# Patient Record
Sex: Male | Born: 1985 | Race: Black or African American | Hispanic: No | Marital: Married | Smoking: Never smoker
Health system: Southern US, Community
[De-identification: ages and names within clinical notes are randomized; demographics above are authoritative.]

## PROBLEM LIST (undated history)

## (undated) DIAGNOSIS — K219 Gastro-esophageal reflux disease without esophagitis: Secondary | ICD-10-CM

## (undated) DIAGNOSIS — I10 Essential (primary) hypertension: Secondary | ICD-10-CM

---

## 2009-04-05 ENCOUNTER — Encounter: Admission: RE | Admit: 2009-04-05 | Discharge: 2009-04-05 | Payer: Self-pay | Admitting: Family Medicine

## 2017-01-27 ENCOUNTER — Emergency Department (HOSPITAL_COMMUNITY): Payer: 59

## 2017-01-27 ENCOUNTER — Emergency Department (HOSPITAL_COMMUNITY)
Admission: EM | Admit: 2017-01-27 | Discharge: 2017-01-27 | Disposition: A | Discharge status: Home / Self Care | Payer: 59 | Attending: Emergency Medicine | Admitting: Emergency Medicine

## 2017-01-27 ENCOUNTER — Encounter (HOSPITAL_COMMUNITY): Payer: Self-pay | Admitting: Emergency Medicine

## 2017-01-27 DIAGNOSIS — I10 Essential (primary) hypertension: Secondary | ICD-10-CM | POA: Insufficient documentation

## 2017-01-27 DIAGNOSIS — K2970 Gastritis, unspecified, without bleeding: Secondary | ICD-10-CM | POA: Diagnosis not present

## 2017-01-27 DIAGNOSIS — Z79899 Other long term (current) drug therapy: Secondary | ICD-10-CM | POA: Diagnosis not present

## 2017-01-27 DIAGNOSIS — K2900 Acute gastritis without bleeding: Secondary | ICD-10-CM

## 2017-01-27 DIAGNOSIS — R109 Unspecified abdominal pain: Secondary | ICD-10-CM | POA: Diagnosis present

## 2017-01-27 HISTORY — DX: Essential (primary) hypertension: I10

## 2017-01-27 HISTORY — DX: Gastro-esophageal reflux disease without esophagitis: K21.9

## 2017-01-27 LAB — COMPREHENSIVE METABOLIC PANEL
ALT: 40 U/L (ref 17–63)
AST: 29 U/L (ref 15–41)
Albumin: 4.5 g/dL (ref 3.5–5.0)
Alkaline Phosphatase: 91 U/L (ref 38–126)
Anion gap: 8 (ref 5–15)
BUN: 16 mg/dL (ref 6–20)
CHLORIDE: 102 mmol/L (ref 101–111)
CO2: 26 mmol/L (ref 22–32)
Calcium: 9.3 mg/dL (ref 8.9–10.3)
Creatinine, Ser: 1.25 mg/dL — ABNORMAL HIGH (ref 0.61–1.24)
Glucose, Bld: 101 mg/dL — ABNORMAL HIGH (ref 65–99)
Potassium: 3.7 mmol/L (ref 3.5–5.1)
SODIUM: 136 mmol/L (ref 135–145)
Total Bilirubin: 1.4 mg/dL — ABNORMAL HIGH (ref 0.3–1.2)
Total Protein: 8.2 g/dL — ABNORMAL HIGH (ref 6.5–8.1)

## 2017-01-27 LAB — CBC
HCT: 49.4 % (ref 39.0–52.0)
HEMOGLOBIN: 17.4 g/dL — AB (ref 13.0–17.0)
MCH: 31 pg (ref 26.0–34.0)
MCHC: 35.2 g/dL (ref 30.0–36.0)
MCV: 87.9 fL (ref 78.0–100.0)
Platelets: 292 10*3/uL (ref 150–400)
RBC: 5.62 MIL/uL (ref 4.22–5.81)
RDW: 12.3 % (ref 11.5–15.5)
WBC: 6.7 10*3/uL (ref 4.0–10.5)

## 2017-01-27 LAB — URINALYSIS, ROUTINE W REFLEX MICROSCOPIC
Bacteria, UA: NONE SEEN
Glucose, UA: NEGATIVE mg/dL
HGB URINE DIPSTICK: NEGATIVE
KETONES UR: NEGATIVE mg/dL
LEUKOCYTES UA: NEGATIVE
Nitrite: NEGATIVE
PH: 5 (ref 5.0–8.0)
Protein, ur: 30 mg/dL — AB
SPECIFIC GRAVITY, URINE: 1.035 — AB (ref 1.005–1.030)
SQUAMOUS EPITHELIAL / LPF: NONE SEEN

## 2017-01-27 LAB — LIPASE, BLOOD: LIPASE: 23 U/L (ref 11–51)

## 2017-01-27 MED ORDER — SODIUM CHLORIDE 0.9 % IV SOLN
INTRAVENOUS | Status: DC
  Administered 2017-01-27: 14:00:00 via INTRAVENOUS

## 2017-01-27 MED ORDER — IOPAMIDOL (ISOVUE-300) INJECTION 61%
INTRAVENOUS | Status: AC
  Administered 2017-01-27: 100 mL
  Filled 2017-01-27: qty 100

## 2017-01-27 MED ORDER — FAMOTIDINE IN NACL 20-0.9 MG/50ML-% IV SOLN
20.0000 mg | Freq: Once | INTRAVENOUS | Status: AC
  Administered 2017-01-27: 20 mg via INTRAVENOUS
  Filled 2017-01-27: qty 50

## 2017-01-27 MED ORDER — ONDANSETRON HCL 4 MG/2ML IJ SOLN
4.0000 mg | Freq: Once | INTRAMUSCULAR | Status: AC
  Administered 2017-01-27: 4 mg via INTRAVENOUS
  Filled 2017-01-27: qty 2

## 2017-01-27 NOTE — Discharge Instructions (Signed)
Make an appointment to follow up with your doctor. Return here for worsening symptoms.

## 2017-01-27 NOTE — ED Notes (Signed)
Bed: WTR9 Expected date:  Expected time:  Means of arrival:  Comments: 

## 2017-01-27 NOTE — ED Triage Notes (Addendum)
Pt reports abd pain for the past several weeks. Has seen PCP for this and is on acid reflux medications for this. Pt reports his medications are helping.  Pt reports he ate a large meal yesterday and had an exacerbation of symptoms along with 1 episode of n/v/d. Pt reports he feels nauseated today which worse with lying down. Threw up 1x this am.

## 2017-01-27 NOTE — ED Triage Notes (Signed)
Ultrasound IV start requested by pt.

## 2017-01-27 NOTE — ED Provider Notes (Signed)
[facility]-EMERGENCY DEPT Provider Note   CSN: [id number] Arrival date & time: 01/27/17  0915     History   Chief Complaint Chief Complaint  Patient presents with  . Abdominal Pain    HPI Michael Suarez is a 32 y.o. male who presents to the ED with abdominal pain. Patient reports that the pain started a few weeks ago. Patient went to his PCP and was started on medication for acid reflex and the medication does seem to help. Patient reports eating a large meal with a lot of grease yesterday and had worsening symptoms and n/v. He continues to have nausea today but no vomiting. The pain is in the epigastric area.  HPI  Past Medical History:  Diagnosis Date  . GERD (gastroesophageal reflux disease)   . Hypertension     There are no active problems to display for this patient.   History reviewed. No pertinent surgical history.     Home Medications    Prior to Admission medications   Medication Sig Start Date End Date Taking? Authorizing Provider  amLODipine (NORVASC) 10 MG tablet Take 10 mg by mouth daily. 01/21/17  Yes [provider]  naproxen sodium (ALEVE) 220 MG tablet Take 440 mg by mouth daily as needed (PAIN, HEADACHE, STOMACH PAIN).   Yes [provider]  omeprazole (PRILOSEC) 20 MG capsule Take 20 mg by mouth daily.   Yes [provider]    Family History History reviewed. No pertinent family history.  Social History Social History   Tobacco Use  . Smoking status: Never Smoker  . Smokeless tobacco: Never Used  Substance Use Topics  . Alcohol use: Yes    Frequency: Never    Comment: occasional  . Drug use: Not on file     Allergies   Patient has no known allergies.   Review of Systems Review of Systems  Constitutional: Positive for chills. Negative for fever.  HENT: Negative.   Eyes: Negative for visual disturbance.  Respiratory: Negative for cough, shortness of breath and wheezing.     Cardiovascular: Negative for chest pain.       Epigastric burning  Gastrointestinal: Positive for abdominal pain, nausea and vomiting.  Genitourinary: Negative for dysuria, hematuria and urgency.  Musculoskeletal: Negative for arthralgias, back pain, joint swelling and neck stiffness.  Skin: Negative for rash.  Neurological: Negative for syncope, light-headedness and headaches.  Hematological: Negative for adenopathy.  Psychiatric/Behavioral: Negative for confusion. The patient is not nervous/anxious.      Physical Exam Updated Vital Signs BP 126/86   Pulse 65   Temp 98.3 F (36.8 C) (Oral)   Resp 17   SpO2 99%   Physical Exam  Constitutional: He appears well-developed and well-nourished. No distress.  HENT:  Head: Normocephalic and atraumatic.  Mouth/Throat: Oropharynx is clear and moist.  Eyes: Conjunctivae and EOM are normal. Pupils are equal, round, and reactive to light.  Neck: Normal range of motion. Neck supple.  Cardiovascular: Normal rate and regular rhythm.  Pulmonary/Chest: Effort normal and breath sounds normal.  Abdominal: Soft. There is tenderness in the epigastric area. There is no rebound and no guarding.  Musculoskeletal: Normal range of motion.  Neurological: He is alert.  Skin: Skin is warm and dry.  Psychiatric: He has a normal mood and affect. His behavior is normal.  Nursing note and vitals reviewed.    ED Treatments / Results  Labs (all labs ordered are listed, but only abnormal results are displayed) Labs Reviewed  COMPREHENSIVE METABOLIC PANEL - Abnormal; Notable for the following components:      Result Value   Glucose, Bld 101 (*)    Creatinine, Ser 1.25 (*)    Total Protein 8.2 (*)    Total Bilirubin 1.4 (*)    All other components within normal limits  CBC - Abnormal; Notable for the following components:   Hemoglobin 17.4 (*)    All other components within normal limits  URINALYSIS, ROUTINE W REFLEX MICROSCOPIC - Abnormal; Notable  for the following components:   Color, Urine AMBER (*)    Specific Gravity, Urine 1.035 (*)    Bilirubin Urine SMALL (*)    Protein, ur 30 (*)    All other components within normal limits  LIPASE, BLOOD  H. PYLORI ANTIBODY, IGG    Radiology Ct Abdomen Pelvis W Contrast  Result Date: 01/27/2017 CLINICAL DATA:  Hematemesis.  Nausea and vomiting. EXAM: CT ABDOMEN AND PELVIS WITH CONTRAST TECHNIQUE: Multidetector CT imaging of the abdomen and pelvis was performed using the standard protocol following bolus administration of intravenous contrast. CONTRAST:  ISOVUE-300 IOPAMIDOL (ISOVUE-300) INJECTION 61% COMPARISON:  None. FINDINGS: Lower chest: Lung bases are clear. Hepatobiliary: There is hepatic steatosis. There is a 5 mm probable cyst in the inferior posterior segment right lobe of liver. No other focal lesion evident. Gallbladder wall is not appreciably thickened. There is no biliary duct dilatation. Pancreas: There is no pancreatic mass or inflammatory focus. Spleen: No splenic lesions are evident. Adrenals/Urinary Tract: Adrenals appear normal bilaterally. Kidneys bilaterally show no evident mass or hydronephrosis on either side. There is no renal or ureteral calculus on either side. Urinary bladder is midline with wall thickness within normal limits. Stomach/Bowel: There is no evident bowel wall or mesenteric thickening. No bowel obstruction evident. No free air or portal venous air. Vascular/Lymphatic: There is no abdominal aortic aneurysm. No vascular lesions are evident in the abdomen or pelvis. By size criteria, there is no adenopathy in the abdomen or pelvis. There are several subcentimeter lymph nodes in the right mid to lower abdomen. Reproductive: Prostate and seminal vesicles appear normal in size and contour. No pelvic mass evident. Other: There is no appendiceal region inflammation. No abscess or ascites is evident in the abdomen or pelvis. There is a small ventral hernia containing  fat. There is fat in each inguinal ring, slightly larger on the left than on the right. Musculoskeletal: There are no blastic or lytic bone lesions. No intramuscular or abdominal wall lesion evident. IMPRESSION: 1. There are several subcentimeter lymph nodes in the right mid to lower abdomen. By size criteria, there is no frank adenopathy in the abdomen or pelvis. In the appropriate clinical setting, these lymph nodes could represent a degree of mesenteric adenitis. 2. There is no appreciable bowel obstruction or bowel wall thickening. No abscess. No periappendiceal region inflammation. 3.  No renal or ureteral calculus.  No hydronephrosis. 4.  There is hepatic steatosis. 5. Small ventral hernia containing only fat. Fat in each inguinal ring noted. Electronically Signed   By: Bretta Bang III M.D.   On: 01/27/2017 14:54    Procedures Procedures (including critical care time)  Medications Ordered in ED Medications  famotidine (PEPCID) IVPB 20 mg premix (0 mg Intravenous Stopped 01/27/17 1549)  0.9 %  sodium chloride infusion ( Intravenous Stopped 01/27/17 1540)  ondansetron (ZOFRAN) injection 4 mg (4 mg Intravenous Given 01/27/17 1428)  iopamidol (ISOVUE-300) 61 % injection (100 mLs  Contrast Given 01/27/17 1433)  Initial Impression / Assessment and Plan / ED Course  I have reviewed the triage vital signs and the nursing notes. 32 y.o. male with hx of reflux and here today with nausea, vomiting and epigastric pain stable for d/c without ulcer noted on CT scan. Discussed with the patient that H. Pylori study will not be back today but he can f/u with PCP and they can discuss results when there are back. Patient voices understanding and agrees with plan. Patient feeling better after IV fluids and medications. Patient is eating and drinking in exam room without n/v. Discussed diet for reflux.   Final Clinical Impressions(s) / ED Diagnoses   Final diagnoses:  Acute gastritis, presence of bleeding  unspecified, unspecified gastritis type    ED Discharge Orders    None       Kerrie Buffaloeese, Max Nuno RandlettM, NP 01/27/17 1549    Benjiman CorePickering, Nathan, MD 01/28/17 [phone]

## 2017-01-28 LAB — H. PYLORI ANTIBODY, IGG: H Pylori IgG: <0.8 Index Value (ref 0.00–0.79)

## 2017-02-25 ENCOUNTER — Ambulatory Visit
Admission: RE | Admit: 2017-02-25 | Discharge: 2017-02-25 | Disposition: A | Discharge status: Home / Self Care | Payer: No Typology Code available for payment source | Source: Ambulatory Visit | Attending: Occupational Medicine | Admitting: Occupational Medicine

## 2017-02-25 ENCOUNTER — Other Ambulatory Visit: Payer: Self-pay | Admitting: Occupational Medicine

## 2017-02-25 DIAGNOSIS — Z021 Encounter for pre-employment examination: Secondary | ICD-10-CM

## 2017-10-29 DIAGNOSIS — Z0000 Encounter for general adult medical examination without abnormal findings: Secondary | ICD-10-CM | POA: Diagnosis not present

## 2017-10-29 DIAGNOSIS — E7800 Pure hypercholesterolemia, unspecified: Secondary | ICD-10-CM | POA: Diagnosis not present

## 2017-10-29 DIAGNOSIS — I10 Essential (primary) hypertension: Secondary | ICD-10-CM | POA: Diagnosis not present

## 2017-10-29 DIAGNOSIS — K219 Gastro-esophageal reflux disease without esophagitis: Secondary | ICD-10-CM | POA: Diagnosis not present

## 2017-10-29 DIAGNOSIS — Z202 Contact with and (suspected) exposure to infections with a predominantly sexual mode of transmission: Secondary | ICD-10-CM | POA: Diagnosis not present

## 2017-11-16 DIAGNOSIS — N342 Other urethritis: Secondary | ICD-10-CM | POA: Diagnosis not present

## 2017-11-19 DIAGNOSIS — Z0000 Encounter for general adult medical examination without abnormal findings: Secondary | ICD-10-CM | POA: Diagnosis not present

## 2018-05-22 IMAGING — CT CT ABD-PELV W/ CM
2 of 4 series · 16 of 46 positions shown, 18 images · IV contrast (ISOVUE)
Comparison: None.

CLINICAL DATA: Hematemesis.  Nausea and vomiting.

EXAM:
CT ABDOMEN AND PELVIS WITH CONTRAST
TECHNIQUE: Multidetector CT imaging of the abdomen and pelvis was performed
using the standard protocol following bolus administration of
intravenous contrast.
CONTRAST:  100mL ICQTNP-SBB IOPAMIDOL (ICQTNP-SBB) INJECTION 61%

[Series 2: axial st · axial · 0.73mm/px · z∈[-497,-92]mm · 13 of 93 slices shown, 15 images]
[im 6/93  soft-tissue]
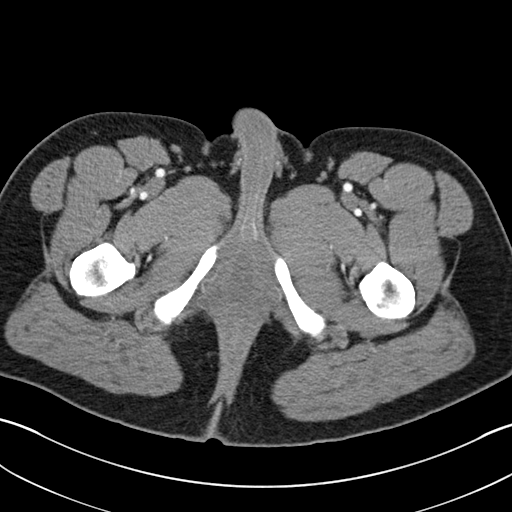
[im 6/93  bone]
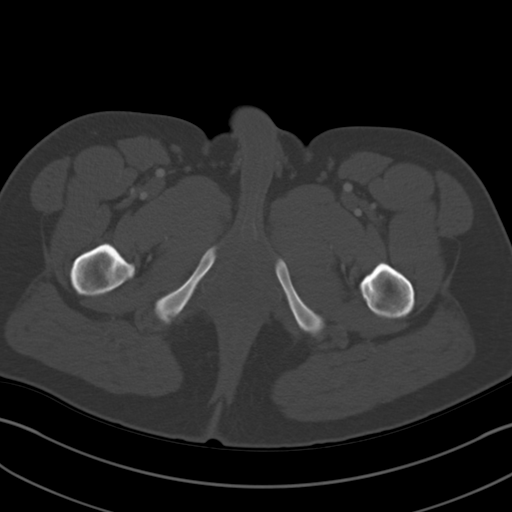
[im 11/93  soft-tissue]
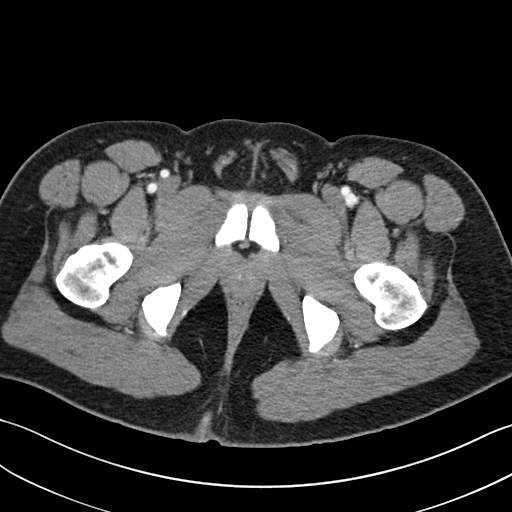
[im 21/93  soft-tissue]
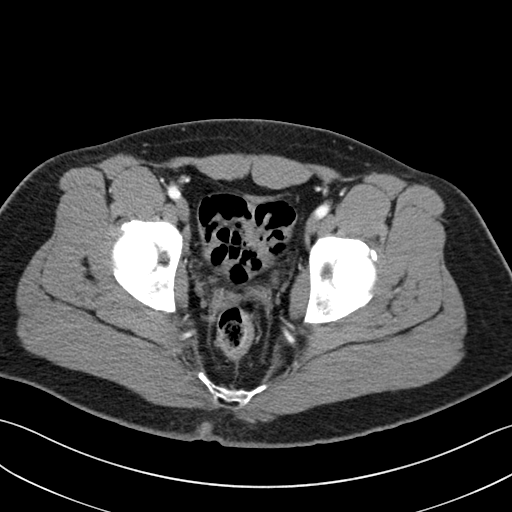
[im 26/93  soft-tissue]
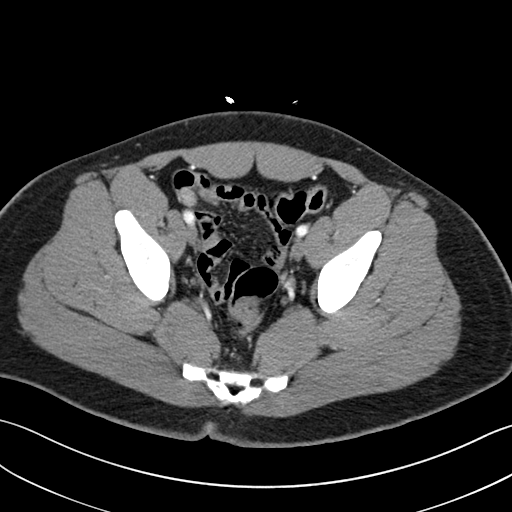
[im 31/93  soft-tissue]
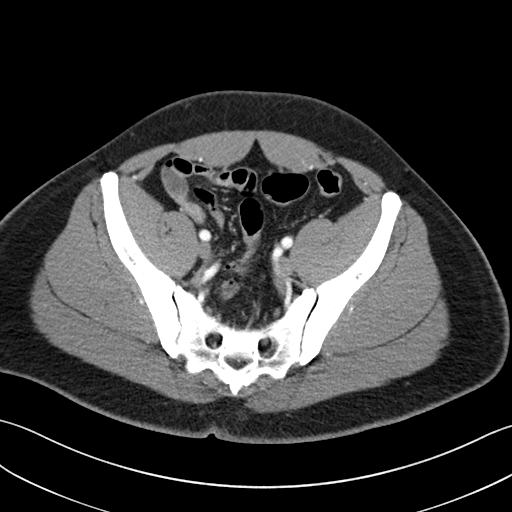
[im 41/93  soft-tissue]
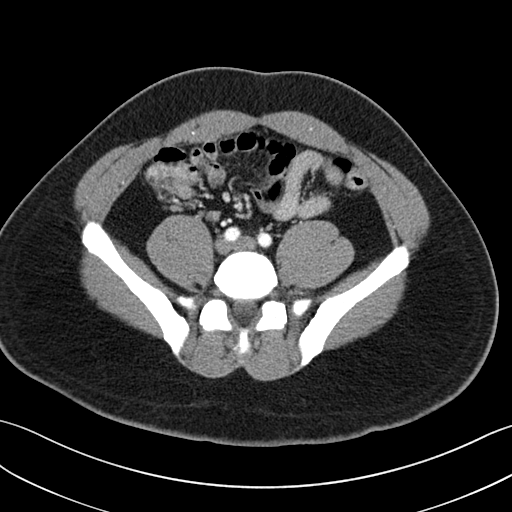
[im 47/93  soft-tissue]
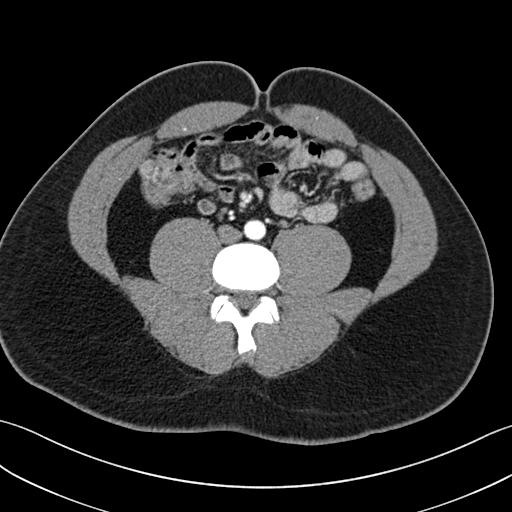
[im 52/93  soft-tissue]
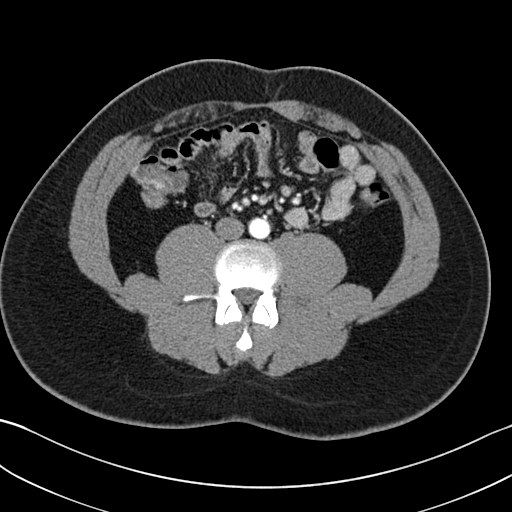
[im 62/93  soft-tissue]
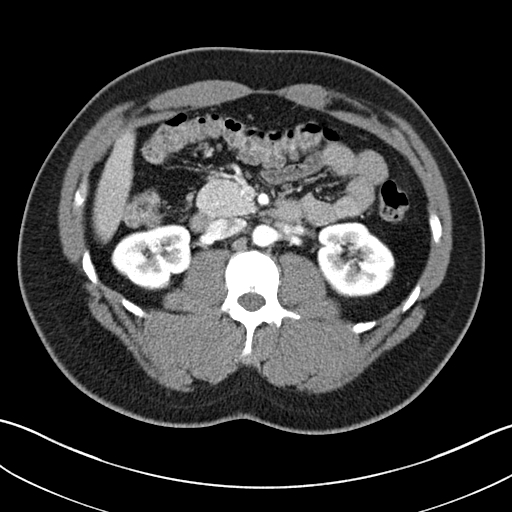
[im 62/93  bone]
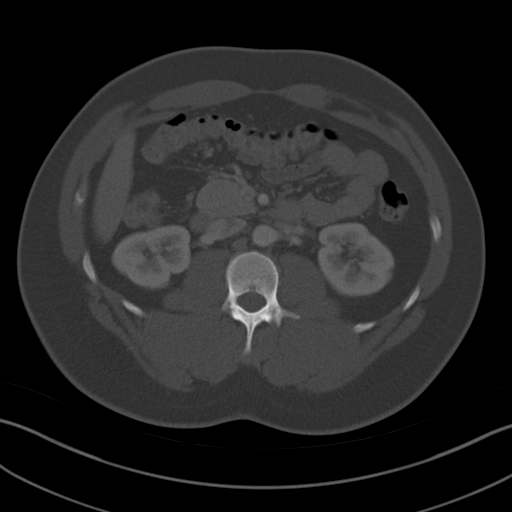
[im 67/93  soft-tissue]
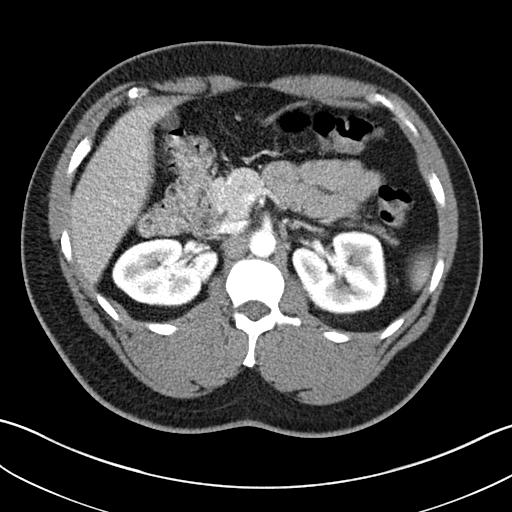
[im 72/93  soft-tissue]
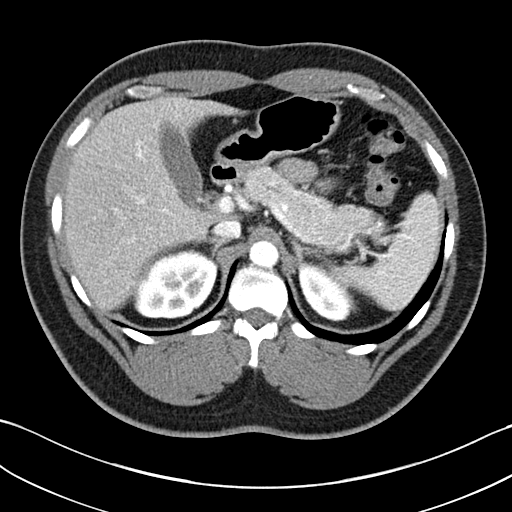
[im 82/93  soft-tissue]
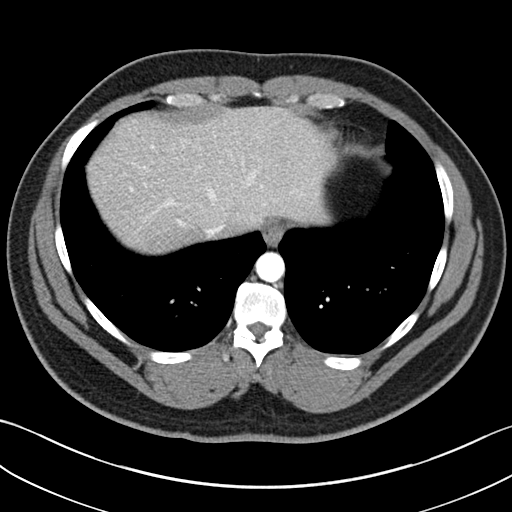
[im 87/93  soft-tissue]
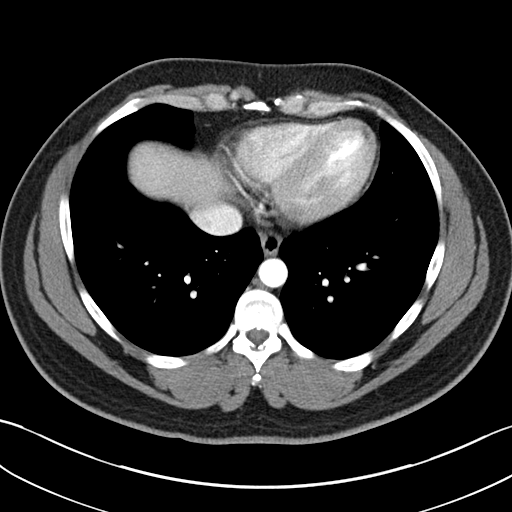

[Series 5: coronal st · coronal · 0.83mm/px · 3 of 93 slices shown]
[im 31/93  soft-tissue]
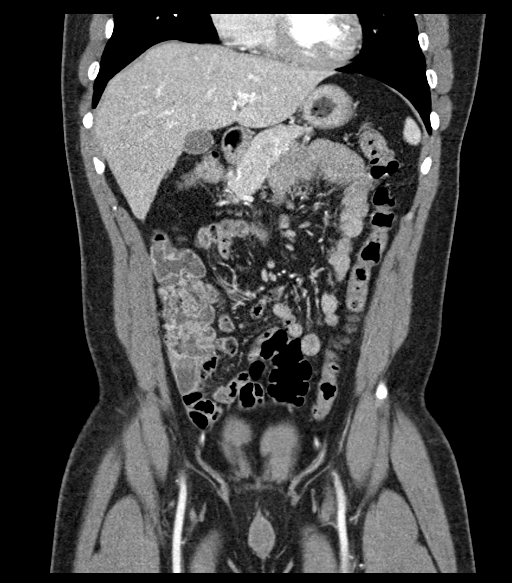
[im 41/93  soft-tissue]
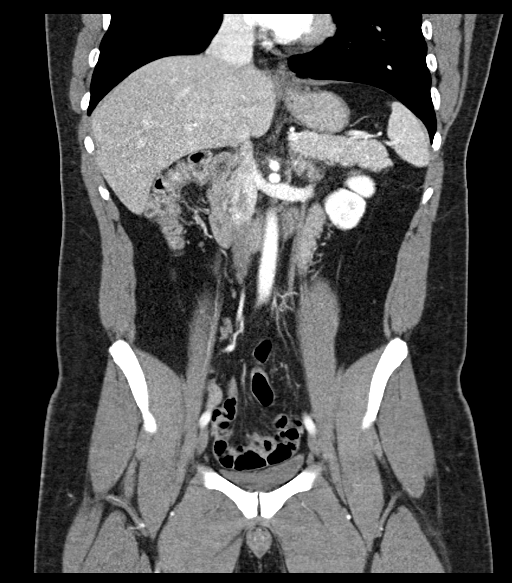
[im 52/93  soft-tissue]
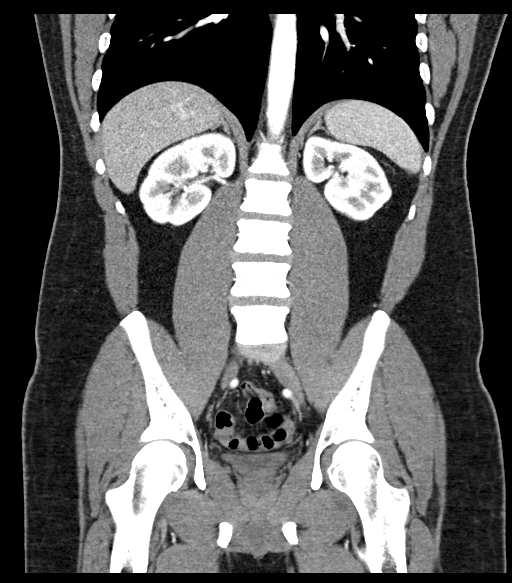

[16 of 46 positions shown; findings below may reference images not displayed]

FINDINGS: Lower chest: Lung bases are clear.

Hepatobiliary: There is hepatic steatosis. There is a 5 mm probable
cyst in the inferior posterior segment right lobe of liver. No other
focal lesion evident. Gallbladder wall is not appreciably thickened.
There is no biliary duct dilatation.

Pancreas: There is no pancreatic mass or inflammatory focus.

Spleen: No splenic lesions are evident.

Adrenals/Urinary Tract: Adrenals appear normal bilaterally. Kidneys
bilaterally show no evident mass or hydronephrosis on either side.
There is no renal or ureteral calculus on either side. Urinary
bladder is midline with wall thickness within normal limits.

Stomach/Bowel: There is no evident bowel wall or mesenteric
thickening. No bowel obstruction evident. No free air or portal
venous air.

Vascular/Lymphatic: There is no abdominal aortic aneurysm. No
vascular lesions are evident in the abdomen or pelvis. By size
criteria, there is no adenopathy in the abdomen or pelvis. There are
several subcentimeter lymph nodes in the right mid to lower abdomen.

Reproductive: Prostate and seminal vesicles appear normal in size
and contour. No pelvic mass evident.

Other: There is no appendiceal region inflammation. No abscess or
ascites is evident in the abdomen or pelvis. There is a small
ventral hernia containing fat. There is fat in each inguinal ring,
slightly larger on the left than on the right.

Musculoskeletal: There are no blastic or lytic bone lesions. No
intramuscular or abdominal wall lesion evident.
IMPRESSION: 1. There are several subcentimeter lymph nodes in the right mid to
lower abdomen. By size criteria, there is no frank adenopathy in the
abdomen or pelvis. In the appropriate clinical setting, these lymph
nodes could represent a degree of mesenteric adenitis.

2. There is no appreciable bowel obstruction or bowel wall
thickening. No abscess. No periappendiceal region inflammation.

3.  No renal or ureteral calculus.  No hydronephrosis.

4.  There is hepatic steatosis.

5. Small ventral hernia containing only fat. Fat in each inguinal
ring noted.
